# Patient Record
Sex: Male | Born: 2000 | Race: Black or African American | Hispanic: No | Marital: Single
Health system: Southern US, Community
[De-identification: ages and names within clinical notes are randomized; demographics above are authoritative.]

---
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
## 2021-02-09 ENCOUNTER — Ambulatory Visit: Payer: 59 | Admitting: Pediatrics

## 2021-02-09 ENCOUNTER — Telehealth: Payer: Self-pay

## 2021-02-09 DIAGNOSIS — Z00129 Encounter for routine child health examination without abnormal findings: Secondary | ICD-10-CM

## 2021-02-09 NOTE — Telephone Encounter (Signed)
Could not come in for appointment today because of a death in the family.

## 2021-05-02 ENCOUNTER — Ambulatory Visit: Payer: 59 | Admitting: Pediatrics

## 2021-05-02 DIAGNOSIS — Z00129 Encounter for routine child health examination without abnormal findings: Secondary | ICD-10-CM

## 2022-01-11 ENCOUNTER — Ambulatory Visit: Payer: Self-pay | Admitting: Internal Medicine

## 2022-04-25 ENCOUNTER — Encounter: Payer: Self-pay | Admitting: Internal Medicine

## 2022-04-25 ENCOUNTER — Ambulatory Visit (INDEPENDENT_AMBULATORY_CARE_PROVIDER_SITE_OTHER): Payer: 59 | Admitting: Internal Medicine

## 2022-04-25 VITALS — BP 104/70 | HR 86 | Temp 98.2°F | Resp 16 | Ht 75.5 in | Wt 157.0 lb

## 2022-04-25 DIAGNOSIS — Z136 Encounter for screening for cardiovascular disorders: Secondary | ICD-10-CM

## 2022-04-25 DIAGNOSIS — Z0000 Encounter for general adult medical examination without abnormal findings: Secondary | ICD-10-CM

## 2022-04-25 DIAGNOSIS — Z1159 Encounter for screening for other viral diseases: Secondary | ICD-10-CM | POA: Insufficient documentation

## 2022-04-25 NOTE — Progress Notes (Signed)
? ?Subjective:  ?Patient ID: Brad [name], male    DOB: [birth year]  Age: 21 y.o. MRN: [id number] ? ?CC: Annual Exam ? ? ?HPI ?Brad [name] presents for a CPX and to establish. He feels well, offers no complaints. ? ?History ?Brad [name] has no past medical history on file.  ? ?He has no past surgical history on file.  ? ?His family history includes ADD / ADHD in his brother; Asthma in his maternal grandmother and mother; Diabetes in his maternal grandfather; Heart disease in his maternal grandfather and paternal grandfather; Hyperlipidemia in his maternal grandfather and paternal grandfather; Hypertension in his father, maternal grandfather, and paternal grandfather; Kidney disease in his paternal grandmother.He reports that he is a non-smoker but has been exposed to tobacco smoke. He has never used smokeless tobacco. He reports that he does not currently use alcohol. He reports that he does not use drugs. ? ?Outpatient Medications Prior to Visit  ?Medication Sig Dispense Refill  ? amphetamine-dextroamphetamine (ADDERALL XR) 10 MG 24 hr capsule Take 1 capsule (10 mg total) by mouth daily with breakfast. 30 capsule 0  ? ?No facility-administered medications prior to visit.  ? ? ?ROS ?Review of Systems  ?Constitutional: Negative.  Negative for appetite change and fatigue.  ?HENT: Negative.    ?Eyes: Negative.   ?Respiratory:  Negative for cough, chest tightness, shortness of breath and wheezing.   ?Cardiovascular:  Negative for chest pain, palpitations and leg swelling.  ?Gastrointestinal:  Negative for abdominal pain, constipation, diarrhea, nausea and vomiting.  ?Endocrine: Negative.   ?Genitourinary: Negative.  Negative for difficulty urinating, scrotal swelling and testicular pain.  ?Musculoskeletal: Negative.   ?Skin: Negative.   ?Neurological: Negative.  Negative for dizziness, weakness, light-headedness and headaches.  ?Hematological:  Negative for adenopathy. Does not bruise/bleed easily.   ?Psychiatric/Behavioral: Negative.    ? ?Objective:  ?BP 104/70 (BP Location: Left Arm, Patient Position: Sitting, Cuff Size: Large)   Pulse 86   Temp 98.2 ?F (36.8 ?C) (Oral)   Resp 16   Ht 6' 3.5" (1.918 m)   Wt 157 lb (71.2 kg)   SpO2 99%   BMI 19.36 kg/m?  ? ?Physical Exam ?Vitals reviewed.  ?HENT:  ?   Nose: Nose normal.  ?   Mouth/Throat:  ?   Mouth: Mucous membranes are moist.  ?Eyes:  ?   General: No scleral icterus. ?   Conjunctiva/sclera: Conjunctivae normal.  ?Cardiovascular:  ?   Rate and Rhythm: Normal rate and regular rhythm.  ?   Heart sounds: No murmur heard. ?Pulmonary:  ?   Effort: Pulmonary effort is normal.  ?   Breath sounds: No stridor. No wheezing, rhonchi or rales.  ?Abdominal:  ?   General: Abdomen is flat.  ?   Palpations: There is no mass.  ?   Tenderness: There is no abdominal tenderness. There is no guarding.  ?   Hernia: No hernia is present.  ?Musculoskeletal:     ?   General: Normal range of motion.  ?   Cervical back: Neck supple.  ?   Right lower leg: No edema.  ?   Left lower leg: No edema.  ?Lymphadenopathy:  ?   Cervical: No cervical adenopathy.  ?Skin: ?   General: Skin is warm and dry.  ?Neurological:  ?   General: No focal deficit present.  ?   Mental Status: He is alert.  ?Psychiatric:     ?   Mood and Affect: Mood normal.     ?  Behavior: Behavior normal.  ? ? ?Lab Results  ?Component Value Date  ? CHOL 131 04/25/2022  ? TRIG 121.0 04/25/2022  ? HDL 40.10 04/25/2022  ? [place] 67 04/25/2022  ?  ? ?Assessment & Plan:  ? ?Brad [name] was seen today for annual exam. ? ?Diagnoses and all orders for this visit: ? ?Routine general medical examination at a health care facility- Exam completed, labs reviewed, vaccines are up-to-date, no cancer screenings indicated, patient education was given. ?-     Lipid panel; Future ?-     Hepatitis C antibody; Future ?-     HIV Antibody (routine testing w rflx); Future ?-     HIV Antibody (routine testing w rflx) ?-     Hepatitis C  antibody ?-     Lipid panel ? ?Need for hepatitis C screening test ?-     Hepatitis C antibody; Future ?-     Hepatitis C antibody ? ? ?I have discontinued Brad Kerns. [name]'s amphetamine-dextroamphetamine. ? ?No orders of the defined types were placed in this encounter. ? ? ? ?Follow-up: Return if symptoms worsen or fail to improve. ? ?Scarlette Calico, MD ?

## 2022-04-25 NOTE — Patient Instructions (Signed)
Health Maintenance, Male Adopting a healthy lifestyle and getting preventive care are important in promoting health and wellness. Ask your health care provider about: The right schedule for you to have regular tests and exams. Things you can do on your own to prevent diseases and keep yourself healthy. What should I know about diet, weight, and exercise? Eat a healthy diet  Eat a diet that includes plenty of vegetables, fruits, low-fat dairy products, and lean protein. Do not eat a lot of foods that are high in solid fats, added sugars, or sodium. Maintain a healthy weight Body mass index (BMI) is a measurement that can be used to identify possible weight problems. It estimates body fat based on height and weight. Your health care provider can help determine your BMI and help you achieve or maintain a healthy weight. Get regular exercise Get regular exercise. This is one of the most important things you can do for your health. Most adults should: Exercise for at least 150 minutes each week. The exercise should increase your heart rate and make you sweat (moderate-intensity exercise). Do strengthening exercises at least twice a week. This is in addition to the moderate-intensity exercise. Spend less time sitting. Even light physical activity can be beneficial. Watch cholesterol and blood lipids Have your blood tested for lipids and cholesterol at 20 years of age, then have this test every 5 years. You may need to have your cholesterol levels checked more often if: Your lipid or cholesterol levels are high. You are older than 20 years of age. You are at high risk for heart disease. What should I know about cancer screening? Many types of cancers can be detected early and may often be prevented. Depending on your health history and family history, you may need to have cancer screening at various ages. This may include screening for: Colorectal cancer. Prostate cancer. Skin cancer. Lung  cancer. What should I know about heart disease, diabetes, and high blood pressure? Blood pressure and heart disease High blood pressure causes heart disease and increases the risk of stroke. This is more likely to develop in people who have high blood pressure readings or are overweight. Talk with your health care provider about your target blood pressure readings. Have your blood pressure checked: Every 3-5 years if you are 18-39 years of age. Every year if you are 40 years old or older. If you are between the ages of 65 and 75 and are a current or former smoker, ask your health care provider if you should have a one-time screening for abdominal aortic aneurysm (AAA). Diabetes Have regular diabetes screenings. This checks your fasting blood sugar level. Have the screening done: Once every three years after age 45 if you are at a normal weight and have a low risk for diabetes. More often and at a younger age if you are overweight or have a high risk for diabetes. What should I know about preventing infection? Hepatitis B If you have a higher risk for hepatitis B, you should be screened for this virus. Talk with your health care provider to find out if you are at risk for hepatitis B infection. Hepatitis C Blood testing is recommended for: Everyone born from 1945 through 1965. Anyone with known risk factors for hepatitis C. Sexually transmitted infections (STIs) You should be screened each year for STIs, including gonorrhea and chlamydia, if: You are sexually active and are younger than 20 years of age. You are older than 20 years of age and your   health care provider tells you that you are at risk for this type of infection. Your sexual activity has changed since you were last screened, and you are at increased risk for chlamydia or gonorrhea. Ask your health care provider if you are at risk. Ask your health care provider about whether you are at high risk for HIV. Your health care provider  may recommend a prescription medicine to help prevent HIV infection. If you choose to take medicine to prevent HIV, you should first get tested for HIV. You should then be tested every 3 months for as long as you are taking the medicine. Follow these instructions at home: Alcohol use Do not drink alcohol if your health care provider tells you not to drink. If you drink alcohol: Limit how much you have to 0-2 drinks a day. Know how much alcohol is in your drink. In the [place] one drink equals one 12 oz bottle of beer (355 mL), one 5 oz glass of wine (148 mL), or one 1 oz glass of hard liquor (44 mL). Lifestyle Do not use any products that contain nicotine or tobacco. These products include cigarettes, chewing tobacco, and vaping devices, such as e-cigarettes. If you need help quitting, ask your health care provider. Do not use street drugs. Do not share needles. Ask your health care provider for help if you need support or information about quitting drugs. General instructions Schedule regular health, dental, and eye exams. Stay current with your vaccines. Tell your health care provider if: You often feel depressed. You have ever been abused or do not feel safe at home. Summary Adopting a healthy lifestyle and getting preventive care are important in promoting health and wellness. Follow your health care provider's instructions about healthy diet, exercising, and getting tested or screened for diseases. Follow your health care provider's instructions on monitoring your cholesterol and blood pressure. This information is not intended to replace advice given to you by your health care provider. Make sure you discuss any questions you have with your health care provider. Document Revised: 04/25/2021 Document Reviewed: 04/25/2021 Elsevier Patient Education  2023 [employer]  

## 2022-04-26 LAB — HEPATITIS C ANTIBODY
Hepatitis C Ab: NONREACTIVE
SIGNAL TO CUT-OFF: 0.07 (ref <1.00)

## 2022-04-26 LAB — LIPID PANEL
Cholesterol: 131 mg/dL (ref 0–200)
HDL: 40.1 mg/dL (ref >39.00)
LDL Cholesterol: 67 mg/dL (ref 0–99)
NonHDL: 90.86
Total CHOL/HDL Ratio: 3
Triglycerides: 121 mg/dL (ref 0.0–149.0)
VLDL: 24.2 mg/dL (ref 0.0–40.0)

## 2022-04-26 LAB — HIV ANTIBODY (ROUTINE TESTING W REFLEX): HIV 1&2 Ab, 4th Generation: NONREACTIVE

## 2022-07-12 DIAGNOSIS — R509 Fever, unspecified: Secondary | ICD-10-CM | POA: Diagnosis not present

## 2022-07-12 DIAGNOSIS — J029 Acute pharyngitis, unspecified: Secondary | ICD-10-CM | POA: Diagnosis not present

## 2022-07-31 ENCOUNTER — Encounter: Payer: Self-pay | Admitting: Pediatrics

## 2022-11-11 ENCOUNTER — Other Ambulatory Visit: Payer: Self-pay | Admitting: Pediatrics

## 2022-11-11 MED ORDER — ONDANSETRON 4 MG PO TBDP: 4.0000 mg | ORAL_TABLET | Freq: Three times a day (TID) | ORAL | 3 refills | Status: AC | PRN

## 2023-01-25 ENCOUNTER — Ambulatory Visit (INDEPENDENT_AMBULATORY_CARE_PROVIDER_SITE_OTHER): Payer: BC Managed Care – PPO | Admitting: Internal Medicine

## 2023-01-25 VITALS — BP 122/82 | HR 84 | Temp 98.3°F | Ht 75.0 in | Wt 156.0 lb

## 2023-01-25 DIAGNOSIS — Z0000 Encounter for general adult medical examination without abnormal findings: Secondary | ICD-10-CM

## 2023-01-25 DIAGNOSIS — Z23 Encounter for immunization: Secondary | ICD-10-CM | POA: Diagnosis not present

## 2023-01-25 DIAGNOSIS — Z111 Encounter for screening for respiratory tuberculosis: Secondary | ICD-10-CM

## 2023-01-25 NOTE — Progress Notes (Signed)
Subjective:  Patient ID: Annetta Maw, male    DOB: Jan 01, 2001  Age: 22 y.o. 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  He needs to be screened for tuberculosis.  He feels well and offers no complaints.  No outpatient medications prior to visit.   No facility-administered medications prior to visit.    ROS Review of Systems  Constitutional:  Negative for diaphoresis and fatigue.  HENT: Negative.  Negative for sore throat.   Eyes:  Negative for discharge.  Respiratory:  Negative for cough and shortness of breath.   Cardiovascular: Negative.   Gastrointestinal: Negative.   Genitourinary:  Negative for difficulty urinating, penile pain, penile swelling and scrotal swelling.  Musculoskeletal: Negative.  Negative for arthralgias.  Skin:  Negative for rash.  Neurological: Negative.  Negative for dizziness.  Hematological:  Negative for adenopathy. Does not bruise/bleed easily.  Psychiatric/Behavioral: Negative.      Objective:  BP 122/82 (BP Location: Left Arm, Patient Position: Sitting, Cuff Size: Normal)   Pulse 84   Temp 98.3 F (36.8 C) (Oral)   Ht 6' 3"$  (1.905 m)   Wt 156 lb (70.8 kg)   SpO2 96%   BMI 19.50 kg/m   BP Readings from Last 3 Encounters:  01/25/23 122/82  04/25/22 104/70  02/06/20 116/74 (41 %, Z = -0.23 /  64 %, Z = 0.36)*   *BP percentiles are based on the 2017 AAP Clinical Practice Guideline for boys    Wt Readings from Last 3 Encounters:  01/25/23 156 lb (70.8 kg)  04/25/22 157 lb (71.2 kg) (55 %, Z= 0.12)*  02/06/20 155 lb 4.8 oz (70.4 kg) (67 %, Z= 0.45)*   * Growth percentiles are based on CDC (Boys, 2-20 Years) data.    Physical Exam Vitals reviewed.  Constitutional:      Appearance: Normal appearance.  HENT:     Nose: Nose normal.     Mouth/Throat:     Mouth: Mucous membranes are moist.  Eyes:      General: No scleral icterus.    Conjunctiva/sclera: Conjunctivae normal.  Cardiovascular:     Rate and Rhythm: Normal rate and regular rhythm.     Heart sounds: No murmur heard.    No friction rub.  Pulmonary:     Effort: Pulmonary effort is normal.     Breath sounds: No stridor. No wheezing, rhonchi or rales.  Abdominal:     General: Abdomen is flat.     Tenderness: There is no abdominal tenderness. There is no guarding.     Hernia: No hernia is present.  Musculoskeletal:        General: Normal range of motion.     Cervical back: Neck supple.     Right lower leg: No edema.     Left lower leg: No edema.  Lymphadenopathy:     Cervical: No cervical adenopathy.  Skin:    General: Skin is warm and dry.  Neurological:     General: No focal deficit present.     Mental Status: He is alert. Mental status is at baseline.  Psychiatric:        Mood and Affect: Mood normal.        Behavior: Behavior normal.     Lab Results  Component Value Date   CHOL 131 04/25/2022   TRIG  121.0 04/25/2022   HDL 40.10 04/25/2022   LDLCALC 67 04/25/2022    DG Chest 2 View  Result Date: 10/21/2013 CLINICAL DATA:  Cough x1 week. EXAM: CHEST  2 VIEW COMPARISON:  None. FINDINGS: The heart size and mediastinal contours are within normal limits. Both lungs are clear. The visualized skeletal structures are unremarkable. IMPRESSION: No active cardiopulmonary disease. Electronically Signed   By: Kathreen Devoid   On: 10/21/2013 10:13    Assessment & Plan:   Uziah was seen today for annual exam.  Diagnoses and all orders for this visit:  Screening for tuberculosis -     QuantiFERON-TB Gold Plus; Future  Routine general medical examination at a health care facility- Exam completed, labs reviewed, vaccines addressed, no cancer screenings indicated, patient education was given.  Other orders -     Flu Vaccine QUAD 6+ mos PF IM (Fluarix Quad PF)   Sherren Kerns. [facility] does not currently have  medications on file.  No orders of the defined types were placed in this encounter.    Follow-up: No follow-ups on file.  Scarlette Calico, MD

## 2023-01-26 ENCOUNTER — Other Ambulatory Visit: Payer: BC Managed Care – PPO

## 2023-01-26 ENCOUNTER — Encounter: Payer: Self-pay | Admitting: Internal Medicine

## 2023-01-26 DIAGNOSIS — Z111 Encounter for screening for respiratory tuberculosis: Secondary | ICD-10-CM | POA: Diagnosis not present

## 2023-01-27 LAB — QUANTIFERON-TB GOLD PLUS
Mitogen-NIL: >10 IU/mL
NIL: 0.04 IU/mL
QuantiFERON-TB Gold Plus: NEGATIVE
TB1-NIL: 0 IU/mL
TB2-NIL: 0 IU/mL

## 2023-01-28 ENCOUNTER — Encounter: Payer: Self-pay | Admitting: Internal Medicine

## 2023-02-12 ENCOUNTER — Ambulatory Visit: Payer: BC Managed Care – PPO | Admitting: Internal Medicine

## 2023-08-15 DIAGNOSIS — Z23 Encounter for immunization: Secondary | ICD-10-CM | POA: Diagnosis not present

## 2023-08-28 ENCOUNTER — Encounter: Payer: Self-pay | Admitting: Pediatrics
